# Patient Record
Sex: Female | Born: 2000 | Race: White | Hispanic: No | Marital: Single
Health system: Southern US, Community
[De-identification: ages and names within clinical notes are randomized; demographics above are authoritative.]

## PROBLEM LIST (undated history)

## (undated) DIAGNOSIS — R519 Headache, unspecified: Secondary | ICD-10-CM

## (undated) DIAGNOSIS — F419 Anxiety disorder, unspecified: Secondary | ICD-10-CM

## (undated) DIAGNOSIS — R51 Headache: Secondary | ICD-10-CM

## (undated) HISTORY — DX: Headache, unspecified: R51.9

## (undated) HISTORY — DX: Headache: R51

## (undated) HISTORY — PX: APPENDECTOMY: SHX54

---
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
## 2019-07-18 ENCOUNTER — Other Ambulatory Visit: Payer: Self-pay

## 2019-07-18 ENCOUNTER — Emergency Department (HOSPITAL_COMMUNITY)
Admission: EM | Admit: 2019-07-18 | Discharge: 2019-07-19 | Disposition: A | Discharge status: Home / Self Care | Payer: Medicaid Other | Attending: Emergency Medicine | Admitting: Emergency Medicine

## 2019-07-18 ENCOUNTER — Encounter (HOSPITAL_COMMUNITY): Payer: Self-pay | Admitting: Emergency Medicine

## 2019-07-18 DIAGNOSIS — S161XXA Strain of muscle, fascia and tendon at neck level, initial encounter: Secondary | ICD-10-CM | POA: Insufficient documentation

## 2019-07-18 DIAGNOSIS — Y92410 Unspecified street and highway as the place of occurrence of the external cause: Secondary | ICD-10-CM | POA: Insufficient documentation

## 2019-07-18 DIAGNOSIS — Z79899 Other long term (current) drug therapy: Secondary | ICD-10-CM | POA: Insufficient documentation

## 2019-07-18 DIAGNOSIS — Y999 Unspecified external cause status: Secondary | ICD-10-CM | POA: Insufficient documentation

## 2019-07-18 DIAGNOSIS — Y9389 Activity, other specified: Secondary | ICD-10-CM | POA: Insufficient documentation

## 2019-07-18 DIAGNOSIS — Z7722 Contact with and (suspected) exposure to environmental tobacco smoke (acute) (chronic): Secondary | ICD-10-CM | POA: Insufficient documentation

## 2019-07-18 DIAGNOSIS — S199XXA Unspecified injury of neck, initial encounter: Secondary | ICD-10-CM | POA: Diagnosis present

## 2019-07-18 MED ORDER — HYDROCODONE-ACETAMINOPHEN 5-325 MG PO TABS
1.0000 | ORAL_TABLET | Freq: Once | ORAL | Status: AC
  Administered 2019-07-18: 1 via ORAL
  Filled 2019-07-18: qty 1

## 2019-07-18 MED ORDER — METHOCARBAMOL 500 MG PO TABS: 500.0000 mg | ORAL_TABLET | Freq: Three times a day (TID) | ORAL | 0 refills | Status: AC | PRN

## 2019-07-18 NOTE — ED Notes (Signed)
ED Provider at bedside. 
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
